# Patient Record
Sex: Female | Born: 1950 | Race: White | Hispanic: No | Marital: Married | Smoking: Never smoker
Health system: Southern US, Community
[De-identification: ages and names within clinical notes are randomized; demographics above are authoritative.]

## PROBLEM LIST (undated history)

## (undated) DIAGNOSIS — I10 Essential (primary) hypertension: Secondary | ICD-10-CM

## (undated) DIAGNOSIS — E119 Type 2 diabetes mellitus without complications: Secondary | ICD-10-CM

## (undated) HISTORY — PX: ABDOMINAL HYSTERECTOMY: SHX81

---

## 2013-03-06 ENCOUNTER — Ambulatory Visit: Payer: Self-pay | Admitting: Physician Assistant

## 2013-09-09 ENCOUNTER — Ambulatory Visit: Payer: Self-pay | Admitting: Emergency Medicine

## 2013-09-09 LAB — RAPID STREP-A WITH REFLX: MICRO TEXT REPORT: NEGATIVE

## 2013-09-12 LAB — BETA STREP CULTURE(ARMC)

## 2016-09-23 ENCOUNTER — Ambulatory Visit
Admission: EM | Admit: 2016-09-23 | Discharge: 2016-09-23 | Disposition: A | Discharge status: Home / Self Care | Payer: Medicare Other | Attending: Family Medicine | Admitting: Family Medicine

## 2016-09-23 DIAGNOSIS — Z7984 Long term (current) use of oral hypoglycemic drugs: Secondary | ICD-10-CM | POA: Diagnosis not present

## 2016-09-23 DIAGNOSIS — I10 Essential (primary) hypertension: Secondary | ICD-10-CM | POA: Diagnosis not present

## 2016-09-23 DIAGNOSIS — B950 Streptococcus, group A, as the cause of diseases classified elsewhere: Secondary | ICD-10-CM | POA: Insufficient documentation

## 2016-09-23 DIAGNOSIS — Z79899 Other long term (current) drug therapy: Secondary | ICD-10-CM | POA: Insufficient documentation

## 2016-09-23 DIAGNOSIS — E119 Type 2 diabetes mellitus without complications: Secondary | ICD-10-CM | POA: Diagnosis not present

## 2016-09-23 DIAGNOSIS — J029 Acute pharyngitis, unspecified: Secondary | ICD-10-CM | POA: Diagnosis present

## 2016-09-23 DIAGNOSIS — J020 Streptococcal pharyngitis: Secondary | ICD-10-CM | POA: Diagnosis not present

## 2016-09-23 HISTORY — DX: Type 2 diabetes mellitus without complications: E11.9

## 2016-09-23 HISTORY — DX: Essential (primary) hypertension: I10

## 2016-09-23 LAB — RAPID STREP SCREEN (MED CTR MEBANE ONLY): Streptococcus, Group A Screen (Direct): POSITIVE — AB

## 2016-09-23 MED ORDER — PENICILLIN G BENZATHINE 1200000 UNIT/2ML IM SUSP
1.2000 10*6.[IU] | Freq: Once | INTRAMUSCULAR | Status: AC
  Administered 2016-09-23: 1.2 10*6.[IU] via INTRAMUSCULAR

## 2016-09-23 NOTE — ED Triage Notes (Signed)
66 year old Caucasian woman presents today with complaints of sore throat, fever, cough-dry since Sunday. Patient states she was on vacation family where she was exposed. Patient states she has been taking OTC Ibuprofen and Tylenol with no relief.

## 2016-09-23 NOTE — ED Provider Notes (Signed)
CSN: [id number]     Arrival date & time 09/23/16  1847 History   First MD Initiated Contact with Patient 09/23/16 1929     Chief Complaint  Patient presents with  . Sore Throat  . Cough   (Consider location/radiation/quality/duration/timing/severity/associated sxs/prior Treatment) HPI  This a 66 year old female who presents with complaints of sore throat fever and a dry cough since Sunday. She has spent last week in [place] with her family from across the nation. He says that several them have become ill and just seemed to pass along to each other. Her was diagnosed with strep throat today. He is afebrile to present time.       Past Medical History:  Diagnosis Date  . Diabetes mellitus without complication (HCC)   . Hypertension    Past Surgical History:  Procedure Laterality Date  . ABDOMINAL HYSTERECTOMY     Family History  Problem Relation Age of Onset  . Healthy Mother   . Mesothelioma Father    Social History  Substance Use Topics  . Smoking status: Never Smoker  . Smokeless tobacco: Never Used  . Alcohol use No   OB History    No data available     Review of Systems  Constitutional: Positive for activity change. Negative for chills, fatigue and fever.  HENT: Positive for sore throat.   All other systems reviewed and are negative.   Allergies  Patient has no known allergies.  Home Medications   Prior to Admission medications   Medication Sig Start Date End Date Taking? Authorizing Provider  benazepril (LOTENSIN) 40 MG tablet Take 40 mg by mouth daily.   Yes [provider]  glimepiride (AMARYL) 2 MG tablet Take 2 mg by mouth daily with breakfast.   Yes [provider]  hydrochlorothiazide (HYDRODIURIL) 25 MG tablet Take 25 mg by mouth daily.   Yes [provider]  metFORMIN (GLUCOPHAGE) 500 MG tablet Take 2,000 mg by mouth at bedtime.   Yes [provider]   Meds Ordered and Administered this Visit   Medications   penicillin g benzathine (BICILLIN LA) 1200000 UNIT/2ML injection 1.2 Million Units (1.2 Million Units Intramuscular Given 09/23/16 1944)    BP (!) 156/71   Pulse 86   Temp 98.4 F (36.9 C) (Oral)   Resp 16   Ht 5\' 3"  (1.6 m)   Wt 160 lb (72.6 kg)   SpO2 98%   BMI 28.34 kg/m  No data found.   Physical Exam  Constitutional: She is oriented to person, place, and time. She appears well-developed and well-nourished. No distress.  HENT:  Head: Normocephalic.  Right Ear: External ear normal.  Left Ear: External ear normal.  Mouth/Throat: Oropharyngeal exudate present.  Examination of the pharynx shows enlarged tonsils that are erythematous with exudate present.  Eyes: Pupils are equal, round, and reactive to light. Right eye exhibits no discharge. Left eye exhibits no discharge.  Neck: Normal range of motion.  Pulmonary/Chest: Effort normal and breath sounds normal.  Musculoskeletal: Normal range of motion.  Lymphadenopathy:    She has no cervical adenopathy.  Neurological: She is alert and oriented to person, place, and time.  Skin: Skin is warm and dry. She is not diaphoretic.  Psychiatric: She has a normal mood and affect. Her behavior is normal. Judgment and thought content normal.  Nursing note and vitals reviewed.   Urgent Care Course     Procedures (including critical care time)  Labs Review Labs Reviewed  RAPID STREP SCREEN (  NOT [facility]) - Abnormal; Notable for the following:       Result Value   Streptococcus, Group A Screen (Direct) POSITIVE (*)    All other components within normal limits    Imaging Review No results found.   Visual Acuity Review  Right Eye Distance:   Left Eye Distance:   Bilateral Distance:    Right Eye Near:   Left Eye Near:    Bilateral Near:     Medications  penicillin g benzathine (BICILLIN LA) 1200000 UNIT/2ML injection 1.2 Million Units (1.2 Million Units Intramuscular Given 09/23/16 1944)      MDM   1. Strep  pharyngitis    Patient will saltwater gargles ad lib. for comfort. I've also recommended Motrin for the inflammation. If she has continued problems she should follow-up with her primary care physician that she should be cured after the injection today. Warned her about being around her grandchildren  until  24 hours after the injection.    Lutricia FeilRoemer, Aundray Cartlidge P, PA-C 09/23/16 2003

## 2017-06-21 ENCOUNTER — Other Ambulatory Visit: Payer: Self-pay | Admitting: Sports Medicine

## 2017-06-21 DIAGNOSIS — M51369 Other intervertebral disc degeneration, lumbar region without mention of lumbar back pain or lower extremity pain: Secondary | ICD-10-CM

## 2017-06-21 DIAGNOSIS — M5136 Other intervertebral disc degeneration, lumbar region: Secondary | ICD-10-CM

## 2017-06-21 DIAGNOSIS — M79605 Pain in left leg: Secondary | ICD-10-CM

## 2017-07-05 ENCOUNTER — Ambulatory Visit
Admission: RE | Admit: 2017-07-05 | Discharge: 2017-07-05 | Disposition: A | Discharge status: Home / Self Care | Payer: Medicare Other | Source: Ambulatory Visit | Attending: Sports Medicine | Admitting: Sports Medicine

## 2017-07-05 DIAGNOSIS — M79605 Pain in left leg: Secondary | ICD-10-CM

## 2017-07-05 DIAGNOSIS — M5126 Other intervertebral disc displacement, lumbar region: Secondary | ICD-10-CM | POA: Insufficient documentation

## 2017-07-05 DIAGNOSIS — M5136 Other intervertebral disc degeneration, lumbar region: Secondary | ICD-10-CM

## 2017-07-05 DIAGNOSIS — M48061 Spinal stenosis, lumbar region without neurogenic claudication: Secondary | ICD-10-CM | POA: Diagnosis not present

## 2017-07-05 DIAGNOSIS — M47817 Spondylosis without myelopathy or radiculopathy, lumbosacral region: Secondary | ICD-10-CM | POA: Diagnosis not present

## 2017-07-25 ENCOUNTER — Other Ambulatory Visit: Payer: Self-pay

## 2017-07-25 ENCOUNTER — Ambulatory Visit
Admission: EM | Admit: 2017-07-25 | Discharge: 2017-07-25 | Disposition: A | Discharge status: Home / Self Care | Payer: Medicare Other | Attending: Emergency Medicine | Admitting: Emergency Medicine

## 2017-07-25 DIAGNOSIS — E119 Type 2 diabetes mellitus without complications: Secondary | ICD-10-CM

## 2017-07-25 DIAGNOSIS — J0100 Acute maxillary sinusitis, unspecified: Secondary | ICD-10-CM | POA: Diagnosis not present

## 2017-07-25 MED ORDER — AMOXICILLIN-POT CLAVULANATE 875-125 MG PO TABS: 1.0000 | ORAL_TABLET | Freq: Two times a day (BID) | ORAL | 0 refills | Status: AC

## 2017-07-25 NOTE — Discharge Instructions (Addendum)
Take medication as prescribed. Rest. Drink plenty of fluids.  ° °Follow up with your primary care physician this week as needed. Return to Urgent care for new or worsening concerns.  ° °

## 2017-07-25 NOTE — ED Triage Notes (Signed)
Pt with 7 days of facial pressure, green nasal drainage in the morning, and left ear feels funny. Pain 6/10

## 2017-07-25 NOTE — ED Provider Notes (Signed)
[facility] ____________________________________________  Time seen: Approximately 1:17 PM  I have reviewed the triage vital signs and the nursing notes.   HISTORY  Chief Complaint Sinusitis   HPI Kaylee Boyd is a 67 y.o. female presenting for evaluation of one week of nasal congestion, nasal drainage, sinus pressure and onset of left ear discomfort for a few days.  States initially she thought she was having allergies, but reports symptoms have progressed and with increased sinus pressure.  States has taken some over-the-counter Advil Cold and Sinus without resolution.  States left ear discomfort is mild and feels like there is fluid present.  Reports recently returned from [place], and that is why she states she felt like it was more allergies initially.  Denies known fevers.  States occasional cough.  Denies other aggravating or alleviating factors.  Reports otherwise feels well. Denies chest pain, shortness of breath, abdominal pain, or rash. Denies recent sickness. Denies recent antibiotic use.   [place]: PCP   Past Medical History:  Diagnosis Date  . Diabetes mellitus without complication (HCC)   . Hypertension     There are no active problems to display for this patient.   Past Surgical History:  Procedure Laterality Date  . ABDOMINAL HYSTERECTOMY       No current facility-administered medications for this encounter.   Current Outpatient Medications:  .  amoxicillin-clavulanate (AUGMENTIN) 875-125 MG tablet, Take 1 tablet by mouth every 12 (twelve) hours., Disp: 20 tablet, Rfl: 0 .  benazepril (LOTENSIN) 40 MG tablet, Take 40 mg by mouth daily., Disp: , Rfl:  .  glimepiride (AMARYL) 2 MG tablet, Take 2 mg by mouth daily with breakfast., Disp: , Rfl:  .  hydrochlorothiazide (HYDRODIURIL) 25 MG tablet, Take 25 mg by mouth daily., Disp: , Rfl:  .  metFORMIN (GLUCOPHAGE) 500 MG tablet, Take 2,000 mg by mouth at bedtime., Disp: ,  Rfl:   Allergies Patient has no known allergies.  Family History  Problem Relation Age of Onset  . Healthy Mother   . Mesothelioma Father     Social History Social History   Tobacco Use  . Smoking status: Never Smoker  . Smokeless tobacco: Never Used  Substance Use Topics  . Alcohol use: No  . Drug use: No    Review of Systems Constitutional: No fever/chills ENT: States had sore throat for one day, no current sore throat. States hoarse voice.  Cardiovascular: Denies chest pain. Respiratory: Denies shortness of breath. Gastrointestinal: No abdominal pain.   Musculoskeletal: Negative for back pain. Skin: Negative for rash.   ____________________________________________   PHYSICAL EXAM:  VITAL SIGNS: ED Triage Vitals  Enc Vitals Group     BP 07/25/17 1249 (!) 150/87     Pulse Rate 07/25/17 1249 90     Resp 07/25/17 1249 18     Temp 07/25/17 1249 98.6 F (37 C)     Temp Source 07/25/17 1249 Oral     SpO2 07/25/17 1249 96 %     Weight 07/25/17 1250 160 lb (72.6 kg)     Height 07/25/17 1250 5\' 2"  (1.575 m)     Head Circumference --      Peak Flow --      Pain Score 07/25/17 1250 6     Pain Loc --      Pain Edu? --      Excl. in GC? --    Constitutional: Alert and oriented. Well appearing and in no acute distress. Eyes:  Conjunctivae are normal. Head: Atraumatic.Mild tenderness to palpation bilateral frontal and Mild to moderate bilateral tenderness maxillary sinuses. No swelling. No erythema.   Ears: no erythema, mild effusion left ear, otherwise normal TMs bilaterally.   Nose: nasal congestion with bilateral nasal turbinate erythema and edema.   Mouth/Throat: Mucous membranes are moist.  Oropharynx non-erythematous.No tonsillar swelling or exudate.  Neck: No stridor.  No cervical spine tenderness to palpation. Hematological/Lymphatic/Immunilogical: No cervical lymphadenopathy. Cardiovascular: Normal rate, regular rhythm. Grossly normal heart sounds.  Good  peripheral circulation. Respiratory: Normal respiratory effort.  No retractions.No wheezes, rales or rhonchi. Good air movement.  Gastrointestinal: Soft and nontender. N Musculoskeletal: No cervical, thoracic or lumbar tenderness to palpation.  Neurologic:  Normal speech and language. No gait instability. Skin:  Skin is warm, dry and intact. No rash noted. Psychiatric: Mood and affect are normal. Speech and behavior are normal.  ___________________________________________   LABS (all labs ordered are listed, but only abnormal results are displayed)  Labs Reviewed - No data to display   PROCEDURES Procedures   INITIAL IMPRESSION / ASSESSMENT AND PLAN / ED COURSE  Pertinent labs & imaging results that were available during my care of the patient were reviewed by me and considered in my medical decision making (see chart for details).  Well-appearing patient.  No acute distress.  Suspect recent viral upper respiratory infection or allergies limited secondary sinusitis.  Encourage rest, fluids, supportive care, will treat with oral Augmentin.Discussed indication, risks and benefits of medications with patient.  Discussed follow up with Primary care physician this week. Discussed follow up and return parameters including no resolution or any worsening concerns. Patient verbalized understanding and agreed to plan.   ____________________________________________   FINAL CLINICAL IMPRESSION(S) / ED DIAGNOSES  Final diagnoses:  Acute maxillary sinusitis, recurrence not specified     ED Discharge Orders        Ordered    amoxicillin-clavulanate (AUGMENTIN) 875-125 MG tablet  Every 12 hours     07/25/17 1317       Note: This dictation was prepared with Dragon dictation along with smaller phrase technology. Any transcriptional errors that result from this process are unintentional.         Renford Dills, NP 07/25/17 1337

## 2020-01-02 ENCOUNTER — Other Ambulatory Visit (HOSPITAL_COMMUNITY): Payer: Self-pay | Admitting: Orthopedic Surgery

## 2020-01-02 ENCOUNTER — Other Ambulatory Visit: Payer: Self-pay | Admitting: Orthopedic Surgery

## 2020-01-02 DIAGNOSIS — M4807 Spinal stenosis, lumbosacral region: Secondary | ICD-10-CM

## 2020-01-02 DIAGNOSIS — G8929 Other chronic pain: Secondary | ICD-10-CM

## 2020-01-02 DIAGNOSIS — M5416 Radiculopathy, lumbar region: Secondary | ICD-10-CM

## 2020-01-02 DIAGNOSIS — Z9889 Other specified postprocedural states: Secondary | ICD-10-CM

## 2020-01-18 ENCOUNTER — Ambulatory Visit
Admission: RE | Admit: 2020-01-18 | Discharge: 2020-01-18 | Disposition: A | Discharge status: Home / Self Care | Payer: Medicare Other | Source: Ambulatory Visit | Attending: Orthopedic Surgery | Admitting: Orthopedic Surgery

## 2020-01-18 ENCOUNTER — Other Ambulatory Visit: Payer: Self-pay

## 2020-01-18 DIAGNOSIS — M5442 Lumbago with sciatica, left side: Secondary | ICD-10-CM | POA: Insufficient documentation

## 2020-01-18 DIAGNOSIS — M5416 Radiculopathy, lumbar region: Secondary | ICD-10-CM

## 2020-01-18 DIAGNOSIS — G8929 Other chronic pain: Secondary | ICD-10-CM | POA: Diagnosis present

## 2020-01-18 DIAGNOSIS — M4807 Spinal stenosis, lumbosacral region: Secondary | ICD-10-CM | POA: Insufficient documentation

## 2020-01-18 DIAGNOSIS — Z9889 Other specified postprocedural states: Secondary | ICD-10-CM | POA: Insufficient documentation

## 2022-05-18 IMAGING — MR MR LUMBAR SPINE W/O CM
5 series · 30 of 48 positions shown · non-contrast
Comparison: MRI of the lumbar spine July 05, 2017.

CLINICAL DATA: Low back pain.

EXAM:
MRI LUMBAR SPINE WITHOUT CONTRAST
TECHNIQUE: Multiplanar, multisequence MR imaging of the lumbar spine was
performed. No intravenous contrast was administered.

[Series 5: T2 · sagittal · 4.0mm · 0.81mm/px · 6 of 17 slices shown (1 of 2)]
[im 1/17]
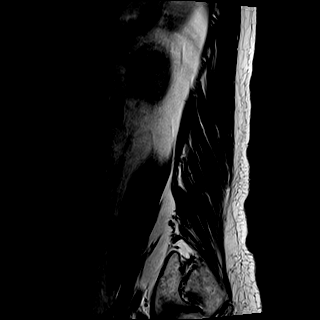
[im 4/17]
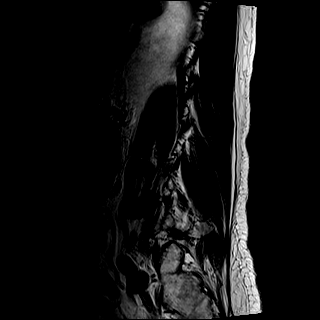
[im 7/17]
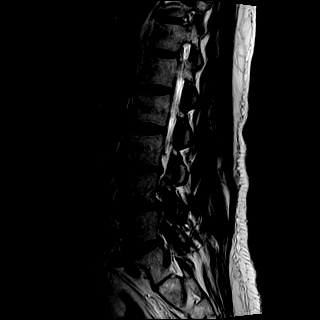
[im 10/17]
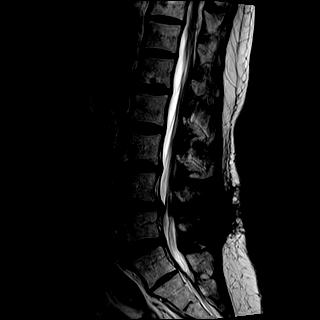
[im 13/17]
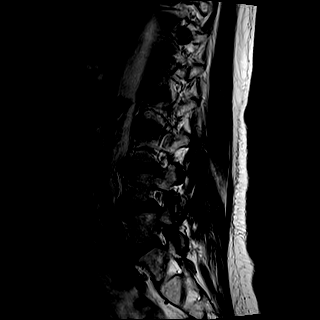
[im 17/17]
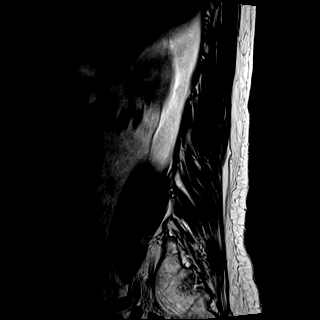

[Series 6: T1 · sagittal · 4.0mm · 0.81mm/px · 7 of 17 slices shown (1 of 2)]
[im 1/17]
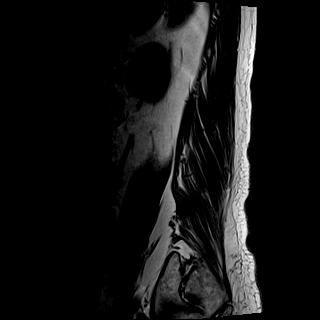
[im 3/17]
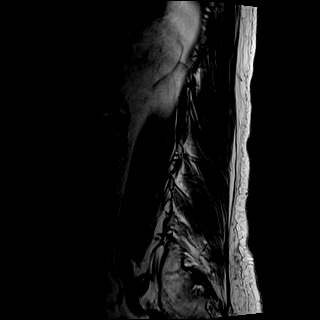
[im 6/17]
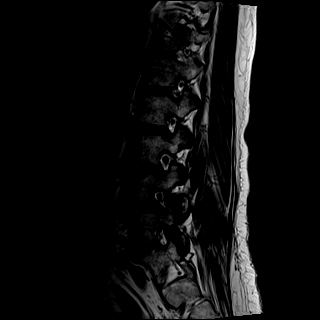
[im 9/17]
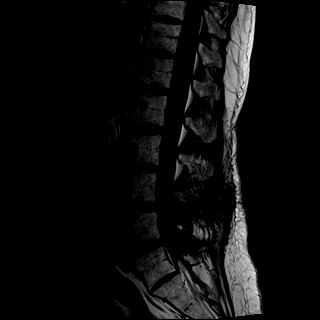
[im 11/17]
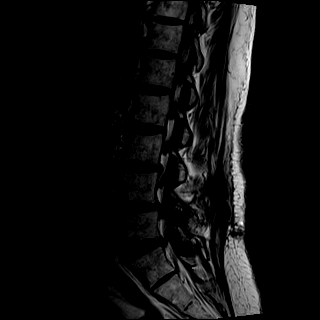
[im 14/17]
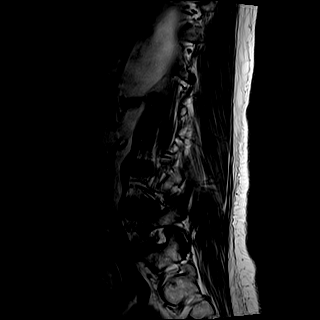
[im 17/17]
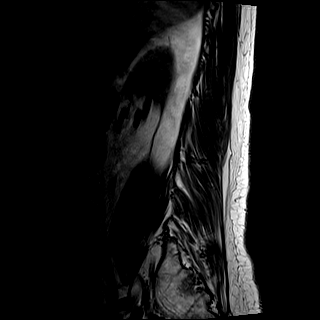

[Series 7: STIR · sagittal · 4.0mm · 0.41mm/px · 1 of 17 slices shown]
[im 1/17]
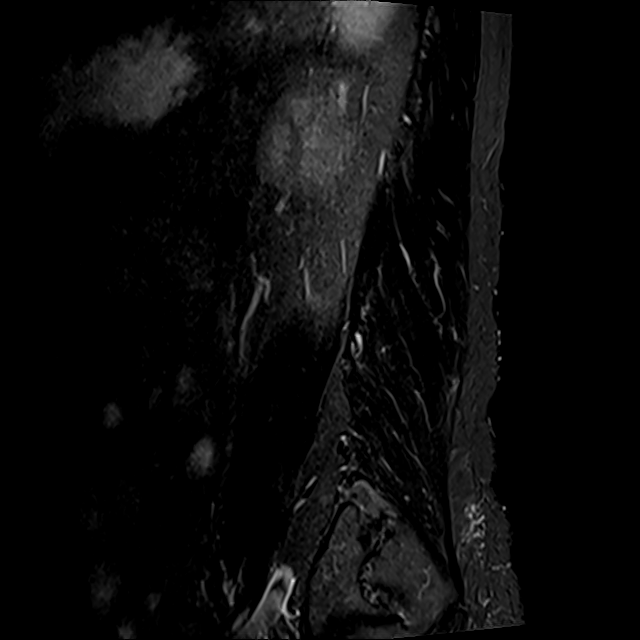

[Series 8: T2 · axial · 4.0mm · 0.78mm/px · z∈[-88,+105]mm · 8 of 34 slices shown (2 of 2)]
[im 1/34]
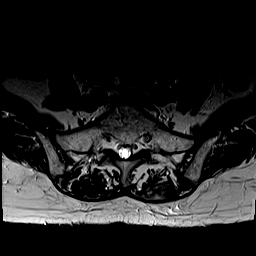
[im 6/34]
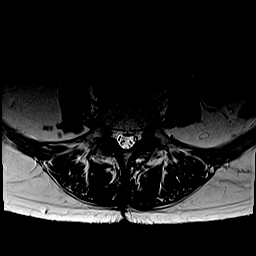
[im 11/34]
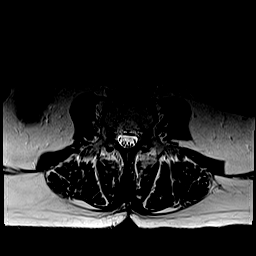
[im 16/34]
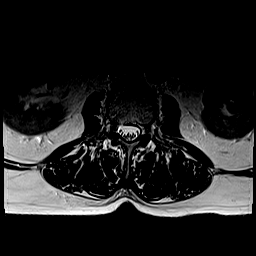
[im 18/34]
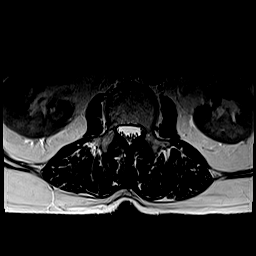
[im 23/34]
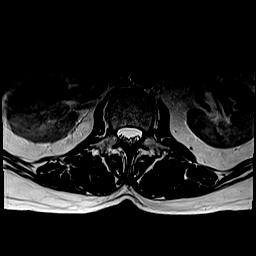
[im 28/34]
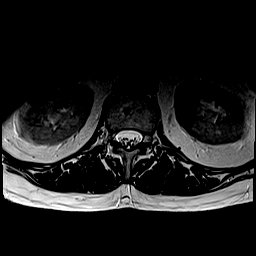
[im 34/34]
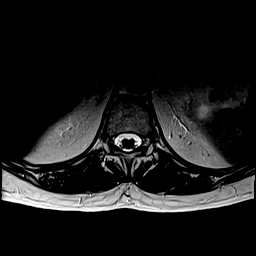

[Series 9: T1 · axial · 4.0mm · 0.39mm/px · z∈[-88,+105]mm · 8 of 34 slices shown (2 of 2)]
[im 1/34]
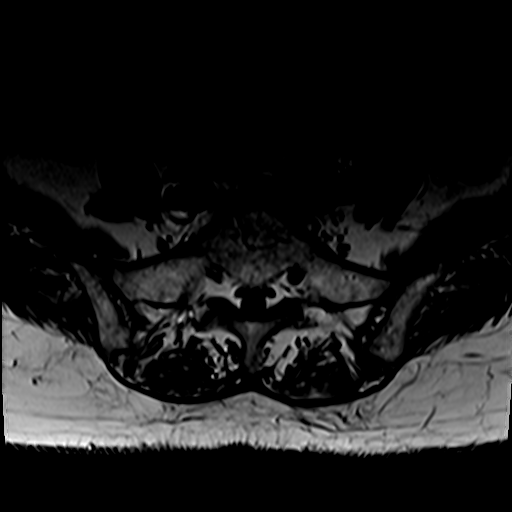
[im 6/34]
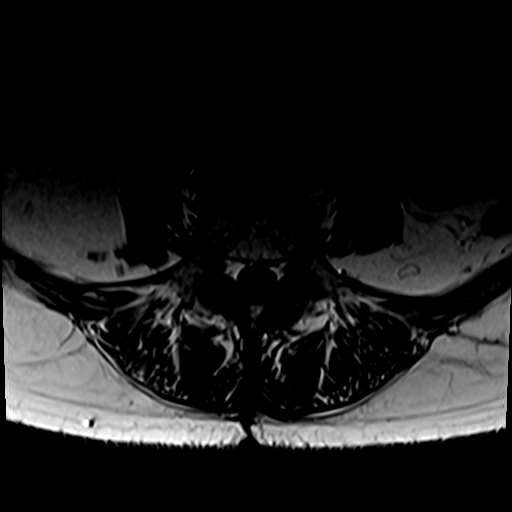
[im 11/34]
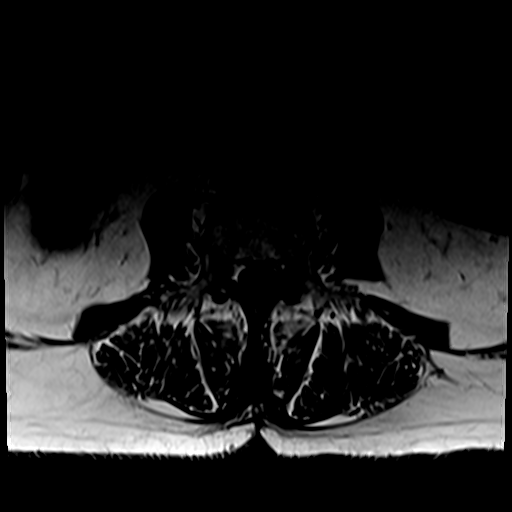
[im 16/34]
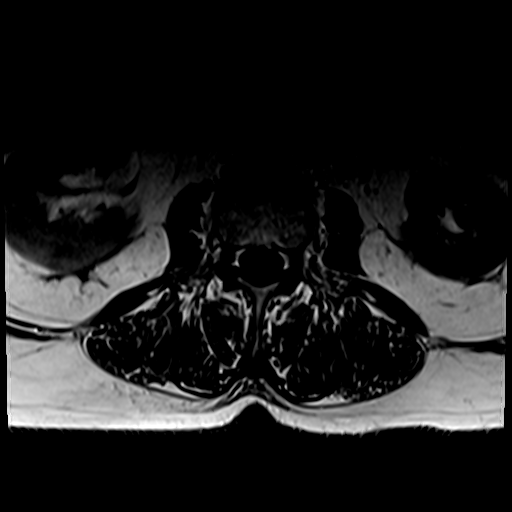
[im 18/34]
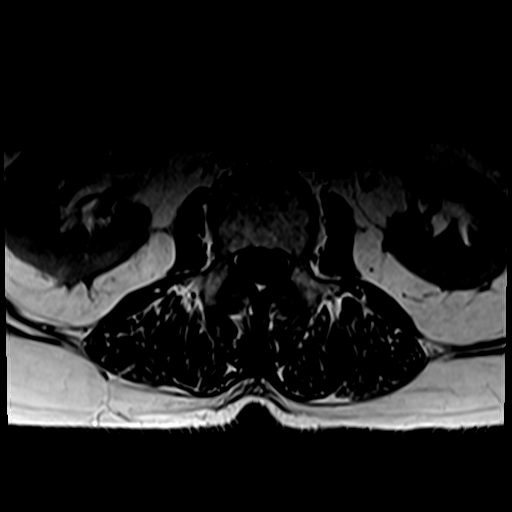
[im 23/34]
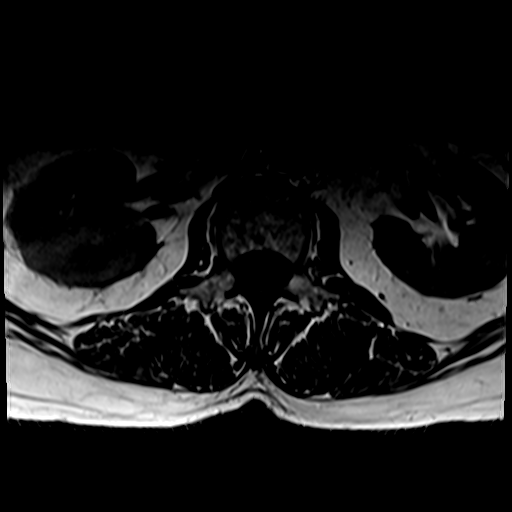
[im 28/34]
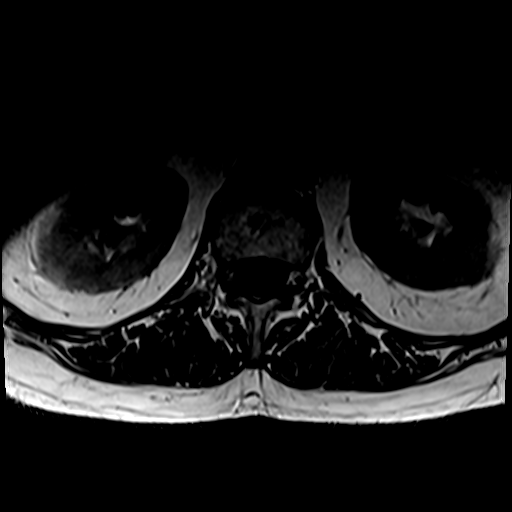
[im 34/34]
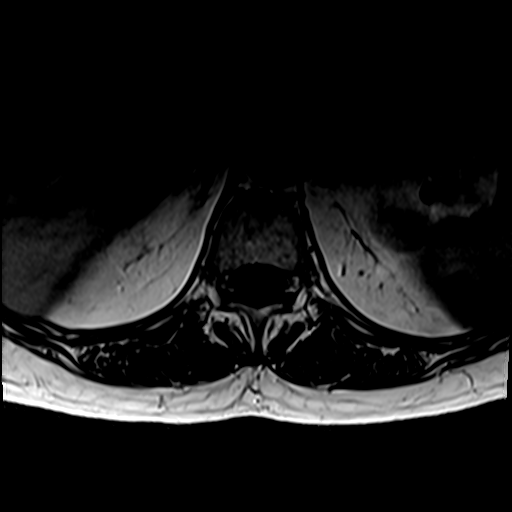

[30 of 48 positions shown; findings below may reference images not displayed]

FINDINGS: Segmentation: A transitional lumbosacral vertebra is assumed to
represent the S1 level with rudimentary disc at S1-2. Careful
correlation with this numbering strategy prior to any procedural
intervention would be recommended.

Alignment: There is a grade 1 anterolisthesis of L4 over L5, new
from prior MRI.

Vertebrae: No fracture, evidence of discitis, or bone lesion.
Postsurgical changes from L4-5 laminotomy.

Conus medullaris and cauda equina: Conus extends to the L1-2 level.
Conus and cauda equina appear normal.

Paraspinal and other soft tissues: Septated left renal cyst
measuring up to 2.8 cm.

Disc levels:

T12: No spinal canal or neural foraminal stenosis.

L1-2: No spinal canal or neural foraminal stenosis.

L2-3: Mild facet degenerative changes. No spinal canal or neural
foraminal stenosis.

L3-4: Loss of disc height, disc bulge and mild facet degenerative
changes resulting in mild bilateral neural foraminal narrowing. No
significant spinal canal stenosis.

L4-5: Disc bulge with a recurrent left subarticular disc extrusion
causing effacement of the left subarticular zone and compressing the
traversing left L5 nerve root. The disc extrusion, in association
with facet degenerative changes and ligamentum flavum redundancy,
result in moderate spinal canal stenosis, mild right and
mild-to-moderate left neural foraminal narrowing.

L5-S1: Shallow disc bulge and mild facet degenerative changes
resulting in mild spinal canal stenosis. No neural foraminal
narrowing.
IMPRESSION: 1. A transitional lumbosacral vertebra is assumed to represent the
S1 level. Careful correlation with this numbering strategy prior to
any procedural intervention would be recommended.
2. Postsurgical changes of L4-5 laminotomy with a recurrent left
subarticular disc extrusion causing effacement of the left
subarticular zone and impingement of the traversing left L5 nerve
root. Moderate spinal canal stenosis and mild right and
mild-to-moderate left neural foraminal narrowing at this level.
